# Patient Record
Sex: Male | Born: 1975 | Race: White | Hispanic: No | Marital: Single | Smoking: Current some day smoker
Health system: Southern US, Community
[De-identification: ages and names within clinical notes are randomized; demographics above are authoritative.]

## PROBLEM LIST (undated history)

## (undated) DIAGNOSIS — K219 Gastro-esophageal reflux disease without esophagitis: Secondary | ICD-10-CM

## (undated) HISTORY — PX: KNEE SURGERY: SHX244

## (undated) HISTORY — PX: NOSE SURGERY: SHX723

---

## 2010-12-28 DIAGNOSIS — N200 Calculus of kidney: Secondary | ICD-10-CM | POA: Insufficient documentation

## 2014-02-19 DIAGNOSIS — M25562 Pain in left knee: Secondary | ICD-10-CM | POA: Insufficient documentation

## 2019-08-15 ENCOUNTER — Encounter (HOSPITAL_COMMUNITY): Payer: Self-pay | Admitting: Emergency Medicine

## 2019-08-15 ENCOUNTER — Encounter (HOSPITAL_COMMUNITY): Payer: Self-pay

## 2019-08-15 ENCOUNTER — Emergency Department (HOSPITAL_COMMUNITY)
Admission: EM | Admit: 2019-08-15 | Discharge: 2019-08-16 | Disposition: A | Discharge status: Home / Self Care | Payer: BC Managed Care – PPO | Source: Home / Self Care | Attending: Emergency Medicine | Admitting: Emergency Medicine

## 2019-08-15 ENCOUNTER — Emergency Department (HOSPITAL_COMMUNITY): Payer: BC Managed Care – PPO

## 2019-08-15 ENCOUNTER — Other Ambulatory Visit: Payer: Self-pay

## 2019-08-15 ENCOUNTER — Emergency Department (HOSPITAL_COMMUNITY)
Admission: EM | Admit: 2019-08-15 | Discharge: 2019-08-15 | Disposition: A | Discharge status: Home / Self Care | Payer: BC Managed Care – PPO | Attending: Emergency Medicine | Admitting: Emergency Medicine

## 2019-08-15 DIAGNOSIS — Z20828 Contact with and (suspected) exposure to other viral communicable diseases: Secondary | ICD-10-CM | POA: Insufficient documentation

## 2019-08-15 DIAGNOSIS — R197 Diarrhea, unspecified: Secondary | ICD-10-CM | POA: Diagnosis not present

## 2019-08-15 DIAGNOSIS — K529 Noninfective gastroenteritis and colitis, unspecified: Secondary | ICD-10-CM

## 2019-08-15 DIAGNOSIS — R1013 Epigastric pain: Secondary | ICD-10-CM | POA: Diagnosis present

## 2019-08-15 HISTORY — DX: Gastro-esophageal reflux disease without esophagitis: K21.9

## 2019-08-15 LAB — COMPREHENSIVE METABOLIC PANEL
ALT: 36 U/L (ref 0–44)
ALT: 33 U/L (ref 0–44)
AST: 41 U/L (ref 15–41)
AST: 38 U/L (ref 15–41)
Albumin: 4 g/dL (ref 3.5–5.0)
Albumin: 4 g/dL (ref 3.5–5.0)
Alkaline Phosphatase: 64 U/L (ref 38–126)
Alkaline Phosphatase: 68 U/L (ref 38–126)
Anion gap: 10 (ref 5–15)
Anion gap: 11 (ref 5–15)
BUN: 10 mg/dL (ref 6–20)
BUN: 9 mg/dL (ref 6–20)
CO2: 26 mmol/L (ref 22–32)
CO2: 22 mmol/L (ref 22–32)
Calcium: 9.4 mg/dL (ref 8.9–10.3)
Calcium: 9.3 mg/dL (ref 8.9–10.3)
Chloride: 103 mmol/L (ref 98–111)
Chloride: 103 mmol/L (ref 98–111)
Creatinine, Ser: 1.24 mg/dL (ref 0.61–1.24)
Creatinine, Ser: 1.21 mg/dL (ref 0.61–1.24)
GFR calc Af Amer: >60 mL/min (ref >60)
GFR calc Af Amer: >60 mL/min (ref >60)
GFR calc non Af Amer: >60 mL/min (ref >60)
GFR calc non Af Amer: >60 mL/min (ref >60)
Glucose, Bld: 100 mg/dL — ABNORMAL HIGH (ref 70–99)
Glucose, Bld: 104 mg/dL — ABNORMAL HIGH (ref 70–99)
Potassium: 4.2 mmol/L (ref 3.5–5.1)
Potassium: 3.6 mmol/L (ref 3.5–5.1)
Sodium: 139 mmol/L (ref 135–145)
Sodium: 136 mmol/L (ref 135–145)
Total Bilirubin: 0.5 mg/dL (ref 0.3–1.2)
Total Bilirubin: 0.8 mg/dL (ref 0.3–1.2)
Total Protein: 7.3 g/dL (ref 6.5–8.1)
Total Protein: 7.2 g/dL (ref 6.5–8.1)

## 2019-08-15 LAB — URINALYSIS, ROUTINE W REFLEX MICROSCOPIC
Bilirubin Urine: NEGATIVE
Bilirubin Urine: NEGATIVE
Glucose, UA: NEGATIVE mg/dL
Glucose, UA: NEGATIVE mg/dL
Hgb urine dipstick: NEGATIVE
Hgb urine dipstick: NEGATIVE
Ketones, ur: 5 mg/dL — AB
Ketones, ur: 5 mg/dL — AB
Leukocytes,Ua: NEGATIVE
Leukocytes,Ua: NEGATIVE
Nitrite: NEGATIVE
Nitrite: NEGATIVE
Protein, ur: NEGATIVE mg/dL
Protein, ur: NEGATIVE mg/dL
Specific Gravity, Urine: 1.025 (ref 1.005–1.030)
Specific Gravity, Urine: 1.025 (ref 1.005–1.030)
pH: 5 (ref 5.0–8.0)
pH: 5 (ref 5.0–8.0)

## 2019-08-15 LAB — CBC
HCT: 48.1 % (ref 39.0–52.0)
HCT: 49.2 % (ref 39.0–52.0)
Hemoglobin: 16.3 g/dL (ref 13.0–17.0)
Hemoglobin: 16.9 g/dL (ref 13.0–17.0)
MCH: 31.7 pg (ref 26.0–34.0)
MCH: 31.8 pg (ref 26.0–34.0)
MCHC: 33.9 g/dL (ref 30.0–36.0)
MCHC: 34.3 g/dL (ref 30.0–36.0)
MCV: 93.6 fL (ref 80.0–100.0)
MCV: 92.7 fL (ref 80.0–100.0)
Platelets: 299 10*3/uL (ref 150–400)
Platelets: 324 10*3/uL (ref 150–400)
RBC: 5.14 MIL/uL (ref 4.22–5.81)
RBC: 5.31 MIL/uL (ref 4.22–5.81)
RDW: 12 % (ref 11.5–15.5)
RDW: 11.9 % (ref 11.5–15.5)
WBC: 9.1 10*3/uL (ref 4.0–10.5)
WBC: 9.8 10*3/uL (ref 4.0–10.5)
nRBC: 0 % (ref 0.0–0.2)
nRBC: 0 % (ref 0.0–0.2)

## 2019-08-15 LAB — C DIFFICILE QUICK SCREEN W PCR REFLEX
C Diff antigen: NEGATIVE
C Diff interpretation: NOT DETECTED
C Diff toxin: NEGATIVE

## 2019-08-15 LAB — LIPASE, BLOOD
Lipase: 28 U/L (ref 11–51)
Lipase: 25 U/L (ref 11–51)

## 2019-08-15 MED ORDER — CIPROFLOXACIN HCL 500 MG PO TABS: 500.0000 mg | ORAL_TABLET | Freq: Two times a day (BID) | ORAL | 0 refills | Status: DC

## 2019-08-15 MED ORDER — ONDANSETRON HCL 4 MG PO TABS: 4.0000 mg | ORAL_TABLET | Freq: Four times a day (QID) | ORAL | 0 refills | Status: DC | PRN

## 2019-08-15 MED ORDER — SODIUM CHLORIDE 0.9 % IV BOLUS
500.0000 mL | Freq: Once | INTRAVENOUS | Status: AC
  Administered 2019-08-15: 500 mL via INTRAVENOUS

## 2019-08-15 MED ORDER — SODIUM CHLORIDE 0.9 % IV BOLUS
1000.0000 mL | Freq: Once | INTRAVENOUS | Status: AC
  Administered 2019-08-15: 1000 mL via INTRAVENOUS

## 2019-08-15 MED ORDER — SODIUM CHLORIDE 0.9% FLUSH: 3.0000 mL | Freq: Once | INTRAVENOUS | Status: DC

## 2019-08-15 MED ORDER — DICYCLOMINE HCL 10 MG PO CAPS
10.0000 mg | ORAL_CAPSULE | Freq: Once | ORAL | Status: DC
  Filled 2019-08-15: qty 1

## 2019-08-15 MED ORDER — IOHEXOL 300 MG/ML  SOLN
100.0000 mL | Freq: Once | INTRAMUSCULAR | Status: AC | PRN
  Administered 2019-08-15: 100 mL via INTRAVENOUS

## 2019-08-15 MED ORDER — ONDANSETRON 4 MG PO TBDP
4.0000 mg | ORAL_TABLET | Freq: Once | ORAL | Status: AC
  Administered 2019-08-15: 4 mg via ORAL
  Filled 2019-08-15: qty 1

## 2019-08-15 MED ORDER — DICYCLOMINE HCL 20 MG PO TABS: 20.0000 mg | ORAL_TABLET | Freq: Two times a day (BID) | ORAL | 0 refills | Status: DC

## 2019-08-15 NOTE — Discharge Instructions (Addendum)
Imodium as directed on package [employer] Follow up closely with your primary care doctor.  Get help right away if: You have chest pain. You feel extremely weak or you faint. You have bloody or black stools or stools that look like tar. You have severe pain, cramping, or bloating in your abdomen. You have trouble breathing or you are breathing very quickly. Your heart is beating very quickly. Your skin feels cold and clammy. You feel confused. You have signs of dehydration, such as: Dark urine, very little urine, or no urine. Cracked lips. Dry mouth. Sunken eyes. Sleepiness. Weakness.

## 2019-08-15 NOTE — ED Notes (Signed)
Pt called out asking for some meds for nausea.

## 2019-08-15 NOTE — ED Notes (Addendum)
Pt verbalized understanding of followup care, scripts and need for continued hydration. Pt also verbalized understanding of development of symptoms that would require return to ED.

## 2019-08-15 NOTE — ED Triage Notes (Signed)
Pt reports epigastric cramping and diarrhea since last Monday, also reports muscle aches Tuesday, denies fever or vomiting.

## 2019-08-15 NOTE — ED Provider Notes (Signed)
[facility] Provider Note   CSN: [id number] Arrival date & time: 08/15/19  1050     History   Chief Complaint Chief Complaint  Patient presents with  . Abdominal Pain  . Diarrhea    HPI [name] is a 43 y.o. male.     HPI   Pt is a 43 y/o male with a h/o GERD, seasonal allergies who presents to the ED today for eval of abd cramping and diarrhea that began about 6 days ago. He reports 8-10 episodes of diarrhea per day. States it is mostly watery. Denies any NV. Denies fevers. No bloody stools. States abd pain is located to the upper abdomen. Rates pain as mild currently.  Denies chest pain. Denies shortness of breath. Denies cough, congestion, sore throat.   States that his significant other had similar sxs this week but her symptoms have improved. He denies any recent travel. Denies eating any suspect foods. Denies recent abx use.   Past Medical History:  Diagnosis Date  . GERD (gastroesophageal reflux disease)     There are no active problems to display for this patient.   Past Surgical History:  Procedure Laterality Date  . KNEE SURGERY Left   . NOSE SURGERY        Home Medications    Prior to Admission medications   Not on File    Family History No family history on file.  Social History Social History   Tobacco Use  . Smoking status: Not on file  Substance Use Topics  . Alcohol use: Not on file  . Drug use: Not on file     Allergies   Patient has no allergy information on record.   Review of Systems Review of Systems  Constitutional: Negative for chills and fever.  HENT: Negative for ear pain and sore throat.   Eyes: Negative for visual disturbance.  Respiratory: Negative for cough and shortness of breath.   Cardiovascular: Negative for chest pain.  Gastrointestinal: Positive for abdominal pain and diarrhea. Negative for constipation, nausea and vomiting.  Genitourinary: Negative for dysuria and  hematuria.  Musculoskeletal: Negative for back pain.  Skin: Negative for rash.  Neurological: Negative for headaches.  All other systems reviewed and are negative.   Physical Exam Updated Vital Signs BP 136/75 (BP Location: Left Arm)   Pulse 96   Temp 98.5 F (36.9 C) (Oral)   Resp 20   Ht 5\' 6"  (1.676 m)   Wt 63.5 kg   SpO2 99%   BMI 22.60 kg/m   Physical Exam Vitals signs and nursing note reviewed.  Constitutional:      Appearance: He is well-developed. He is not ill-appearing.  HENT:     Head: Normocephalic and atraumatic.     Mouth/Throat:     Mouth: Mucous membranes are dry.  Eyes:     Conjunctiva/sclera: Conjunctivae normal.  Neck:     Musculoskeletal: Neck supple.  Cardiovascular:     Rate and Rhythm: Normal rate and regular rhythm.     Heart sounds: Normal heart sounds. No murmur.  Pulmonary:     Effort: Pulmonary effort is normal. No respiratory distress.     Breath sounds: Normal breath sounds. No wheezing, rhonchi or rales.  Abdominal:     Palpations: Abdomen is soft.     Tenderness: There is no abdominal tenderness. There is no right CVA tenderness or left CVA tenderness.  Skin:    General: Skin is warm and dry.  Neurological:     Mental Status: He is alert.    ED Treatments / Results  Labs (all labs ordered are listed, but only abnormal results are displayed) Labs Reviewed  COMPREHENSIVE METABOLIC PANEL - Abnormal; Notable for the following components:      Result Value   Glucose, Bld 100 (*)    All other components within normal limits  URINALYSIS, ROUTINE W REFLEX MICROSCOPIC - Abnormal; Notable for the following components:   Ketones, ur 5 (*)    All other components within normal limits  GASTROINTESTINAL PANEL BY PCR, STOOL (REPLACES STOOL CULTURE)  C DIFFICILE QUICK SCREEN W PCR REFLEX  NOVEL CORONAVIRUS, NAA (HOSPITAL ORDER, SEND-OUT TO REF LAB)  LIPASE, BLOOD  CBC    EKG None  Radiology No results found.  Procedures  Procedures (including critical care time)  Medications Ordered in ED Medications  sodium chloride flush (NS) 0.9 % injection 3 mL (has no administration in time range)  sodium chloride 0.9 % bolus 500 mL (has no administration in time range)  dicyclomine (BENTYL) capsule 10 mg (has no administration in time range)     Initial Impression / Assessment and Plan / ED Course  I have reviewed the triage vital signs and the nursing notes.  Pertinent labs & imaging results that were available during my care of the patient were reviewed by me and considered in my medical decision making (see chart for details).     Final Clinical Impressions(s) / ED Diagnoses   Final diagnoses:  Diarrhea, unspecified type   43 year old male presenting with 6-day history of watery diarrhea.  No bloody stools.  Abdominal cramping present.  No focal abdominal pain.  No fevers.  Patient afebrile with reassuring vital signs.  Abdomen is without peritoneal signs.  No focal tenderness present.  He does appear somewhat dry.  Labs ordered in triage.  Will obtain GI panel, C. difficile and COVID testing as well.  Patient will be given Bentyl and IV fluids.  CBC WNL CMP with no gross electrolyte derangement.  Normal LFTs and kidney function Lipase within normal limits UA w/o evidence of UTI GI panel collected and pending Cdiff testing is negative COVID testing pending. Pt advised on self quarantine measures.  On reassessment, pt Appears well.  He declined Bentyl stating that he is not having any significant abdominal pain that he feels needs to be treated with medication at this time.  He has had no episodes of vomiting while in the ED.  Repeat abdominal exam reveals no focal tenderness.  Discussed results and plan for discharge with Bentyl.  I discussed that at this time I do not feel he needs CT scan as he likely has a viral process given his symptoms developed shortly after his significant other had the symptoms  earlier this week.  Less likely bacterial cause or secondary to acute intra-abdominal cause she has no history of diverticulosis and labs and vital signs are reassuring.  Discussed that if symptoms progress, if abdominal pain becomes persistent, if he develops fevers or bloody stools then he needs to return to the emergency department as he may need imaging at that time.  He voices understanding and is in agreement with plan.  All questions answered.  Patient stable for discharge  -------------------  Thora Lance was evaluated in Emergency Department on 08/15/2019 for the symptoms described in the history of present illness. He was evaluated in the context of the global COVID-19 pandemic, which necessitated consideration that the patient  might be at risk for infection with the SARS-CoV-2 virus that causes COVID-19. Institutional protocols and algorithms that pertain to the evaluation of patients at risk for COVID-19 are in a state of rapid change based on information released by regulatory bodies including the CDC and federal and state organizations. These policies and algorithms were followed during the patient's care in the ED.   ED Discharge Orders    None       Rayne DuCouture, Gwendolyn Nishi S, PA-C 08/15/19 1700    Tegeler, Canary Brimhristopher J, MD 08/15/19 1946

## 2019-08-15 NOTE — ED Notes (Signed)
Patient transported to CT 

## 2019-08-15 NOTE — ED Provider Notes (Signed)
[facility]   CSN: [id number] Arrival date & time: 08/15/19  2039     History   Chief Complaint Chief Complaint  Patient presents with  . Abdominal Pain  . Diarrhea    HPI Kevin Shaffer is a 43 y.o. male returns emergency department after being seen earlier today for complaint of 6 days of diarrhea, multiple episodes of watery brown stool, new blood or mucus, he is having severe abdominal cramping but denies nausea or vomiting.  Patient has not had any fevers, no recent foreign travel, no contacts with similar symptoms.    HPI  Past Medical History:  Diagnosis Date  . GERD (gastroesophageal reflux disease)     There are no active problems to display for this patient.   Past Surgical History:  Procedure Laterality Date  . KNEE SURGERY Left   . NOSE SURGERY          Home Medications    Prior to Admission medications   Medication Sig Start Date End Date Taking? Authorizing Provider  dicyclomine (BENTYL) 20 MG tablet Take 1 tablet (20 mg total) by mouth 2 (two) times daily for 5 days. 08/15/19 08/20/19  Couture, Cortni S, PA-C  diphenhydrAMINE (BENADRYL) 25 mg capsule Take 25 mg by mouth every 6 (six) hours as needed for allergies.    [provider]  fluticasone (FLONASE) 50 MCG/ACT nasal spray Place 2 sprays into both nostrils daily.    [provider]  tadalafil (CIALIS) 5 MG tablet Take 5 mg by mouth daily as needed for erectile dysfunction. 07/07/19   [provider]    Family History No family history on file.  Social History Social History   Tobacco Use  . Smoking status: Not on file  Substance Use Topics  . Alcohol use: Not on file  . Drug use: Not on file     Allergies   Patient has no known allergies.   Review of Systems Review of Systems  Ten systems reviewed and are negative for acute change, except as noted in the HPI.   Physical Exam Updated Vital Signs BP  135/80 (BP Location: Right Arm)   Pulse (!) 102   Temp 98.6 F (37 C) (Oral)   Resp 18   SpO2 98%   Physical Exam Vitals signs and nursing note reviewed.  Constitutional:      General: He is not in acute distress.    Appearance: He is well-developed. He is not diaphoretic.  HENT:     Head: Normocephalic and atraumatic.  Eyes:     General: No scleral icterus.    Conjunctiva/sclera: Conjunctivae normal.  Neck:     Musculoskeletal: Normal range of motion and neck supple.  Cardiovascular:     Rate and Rhythm: Normal rate and regular rhythm.     Heart sounds: Normal heart sounds.  Pulmonary:     Effort: Pulmonary effort is normal. No respiratory distress.     Breath sounds: Normal breath sounds.  Abdominal:     Palpations: Abdomen is soft.     Tenderness: There is no abdominal tenderness. There is guarding.  Skin:    General: Skin is warm and dry.  Neurological:     Mental Status: He is alert.  Psychiatric:        Behavior: Behavior normal.      ED Treatments / Results  Labs (all labs ordered are listed, but only abnormal results are displayed) Labs Reviewed  COMPREHENSIVE METABOLIC  PANEL - Abnormal; Notable for the following components:      Result Value   Glucose, Bld 104 (*)    All other components within normal limits  LIPASE, BLOOD  CBC  URINALYSIS, ROUTINE W REFLEX MICROSCOPIC    EKG None  Radiology No results found.  Procedures Procedures (including critical care time)  Medications Ordered in ED Medications  sodium chloride flush (NS) 0.9 % injection 3 mL (has no administration in time range)  sodium chloride 0.9 % bolus 1,000 mL (has no administration in time range)     Initial Impression / Assessment and Plan / ED Course  I have reviewed the triage vital signs and the nursing notes.  Pertinent labs & imaging results that were available during my care of the patient were reviewed by me and considered in my medical decision making (see chart for  details).    Patient return to emergency department for continued abdominal cramping and diarrhea.  I reviewed the patient's labs from earlier which showed no elevated white blood cell count, no significant abnormalities on the chemistry panel.  Patient had negative C. difficile.  His stool culture is pending.  I personally reviewed the patient's CT scan of the abdomen which shows small bowel thickening and dilated loops of bowel consistent with enteritis.  There is some free fluid in the pelvis which I reviewed with Dr. Vallery Ridge and with Dr. Serita Grammes of surgery she is likely physiologic transudative material secondary to his enteritis.  Reviewed current literature and given the fact that he has had 6 days he is on the borderline of needing indications for antibiotic therapy and I will go ahead and prescribe Cipro 500 mg twice daily for 5 days.  Patient receiving IV fluids at this time will discharge.  Encourage patient to use antidiarrheals as he is not having bloody diarrhea at this time.  Patient otherwise appears appropriate for discharge and I discussed return precautions and outpatient follow-up.     Final Clinical Impressions(s) / ED Diagnoses   Final diagnoses:  None    ED Discharge Orders    None       Margarita Mail, PA-C 08/16/19 0024    Charlesetta Shanks, MD 09/02/19 [phone]

## 2019-08-15 NOTE — ED Notes (Signed)
Pt moved to rm 5 for comfort and direct access to bathroom due to diarrhea

## 2019-08-15 NOTE — ED Triage Notes (Signed)
Pt c/o upper abd pain and diarrhea. Pain at a 7 and not relieved by OTC taken at home.

## 2019-08-15 NOTE — ED Notes (Signed)
Patient verbalizes understanding of discharge instructions. Opportunity for questioning and answers were provided. Pt discharged from ED. 

## 2019-08-15 NOTE — Discharge Instructions (Addendum)
Stay well hydrated. You were  given medication to help with the abdominal cramping .  You were tested for the coronavirus and your stool was also tested.  The results of these studies will not be available for at least 3 to 5 days.  If the results are abnormal you will be contacted by the hospital and provided with instruction.  If the studies are negative you will not be contacted but you may look at your results on your MyChart.  You will need to self quarantine until you have the results of your coronavirus testing.  If it is positive you will need to self quarantine for at least 7 days since symptoms started.  You will need to have improvement of your symptoms for 72 hours and be fever free for 72 hours before returning to work.  Please follow up with your primary doctor within the next 5-7 days.  Please return to the ER sooner if you have any new or worsening symptoms, or if you have any of the following symptoms:  Abdominal pain that does not go away.  You have a fever.  You keep throwing up (vomiting).  The pain is felt only in portions of the abdomen. Pain in the right side could possibly be appendicitis. In an adult, pain in the left lower portion of the abdomen could be colitis or diverticulitis.  You pass bloody or black tarry stools.  There is bright red blood in the stool.  The constipation stays for more than 4 days.  There is belly (abdominal) or rectal pain.  You do not seem to be getting better.  You have any questions or concerns.

## 2019-08-15 NOTE — ED Notes (Signed)
Pt given warm blankets. Pt asks if he can have a note for work.

## 2019-08-16 LAB — GASTROINTESTINAL PANEL BY PCR, STOOL (REPLACES STOOL CULTURE)

## 2019-08-16 LAB — NOVEL CORONAVIRUS, NAA (HOSP ORDER, SEND-OUT TO REF LAB; TAT 18-24 HRS): SARS-CoV-2, NAA: NOT DETECTED

## 2020-08-15 IMAGING — CT CT ABDOMEN AND PELVIS WITH CONTRAST
3 of 5 series · 16 of 46 positions shown, 18 images · IV contrast (APPLIED)
Comparison: None.

CLINICAL DATA: Upper abdominal pain, diarrhea

EXAM:
CT ABDOMEN AND PELVIS WITH CONTRAST
TECHNIQUE: Multidetector CT imaging of the abdomen and pelvis was performed
using the standard protocol following bolus administration of
intravenous contrast.
CONTRAST:  100mL OMNIPAQUE IOHEXOL 300 MG/ML  SOLN

[Series 3: abdomen 5.0 · axial · 0.64mm/px · z∈[+1116,+1471]mm · 11 of 87 slices shown, 13 images]
[im 8/87  soft-tissue]
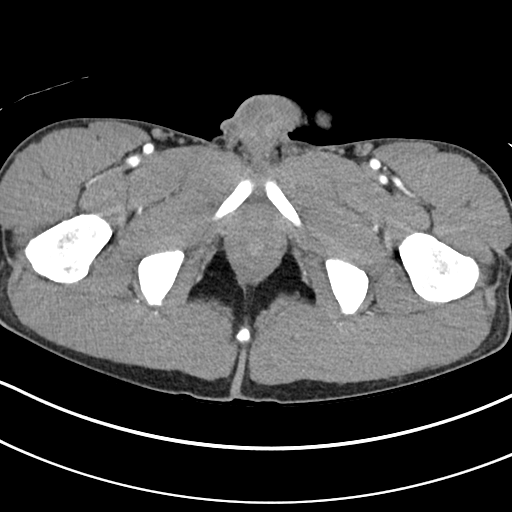
[im 8/87  bone]
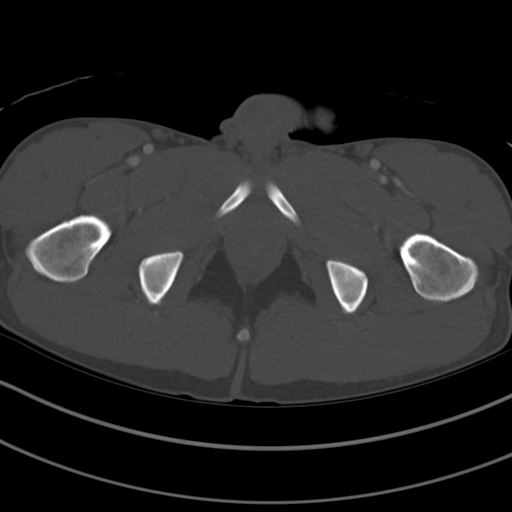
[im 15/87  soft-tissue]
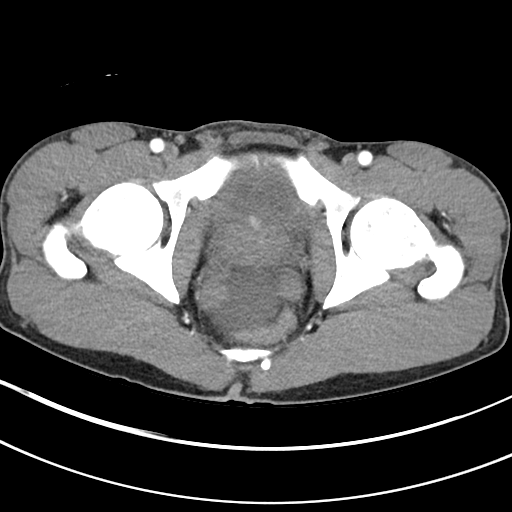
[im 22/87  soft-tissue]
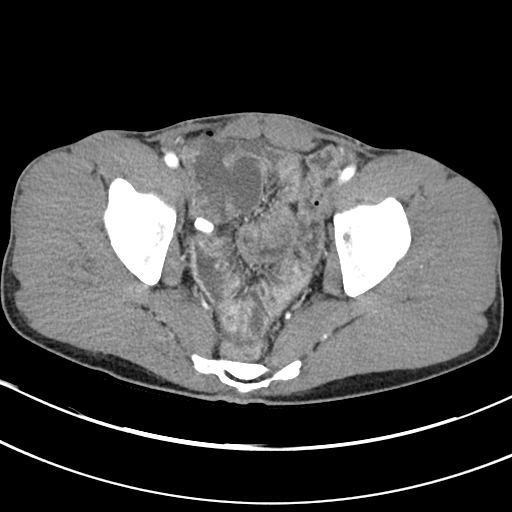
[im 29/87  soft-tissue]
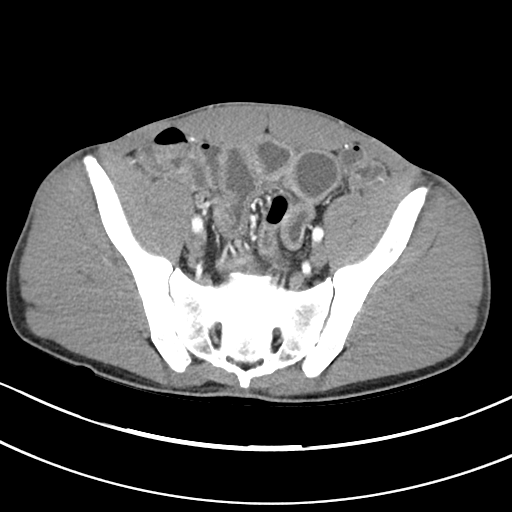
[im 36/87  soft-tissue]
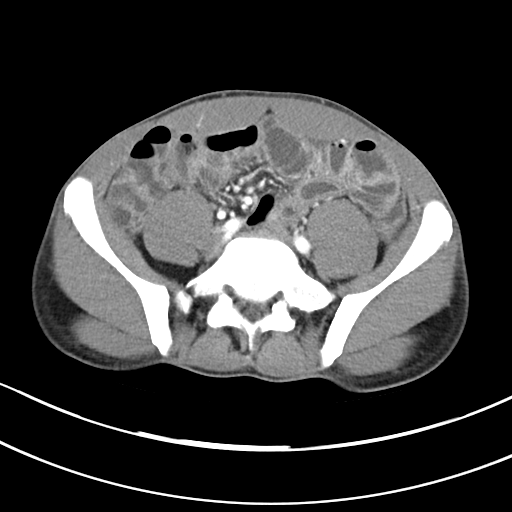
[im 44/87  soft-tissue]
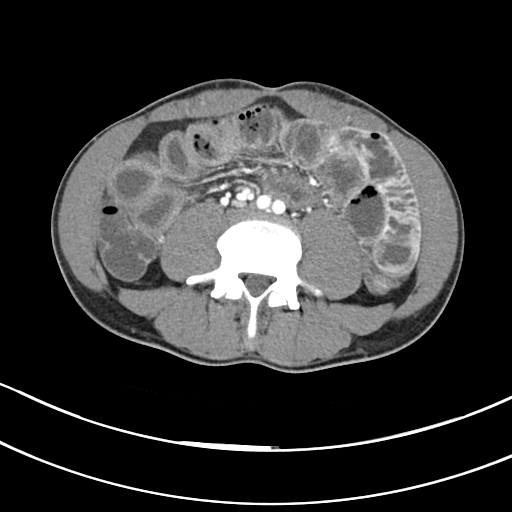
[im 51/87  soft-tissue]
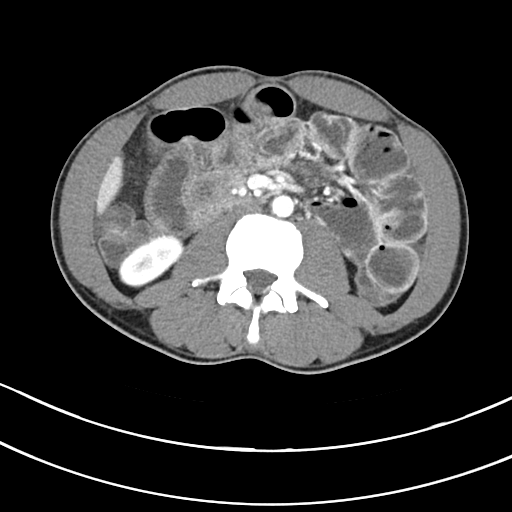
[im 58/87  soft-tissue]
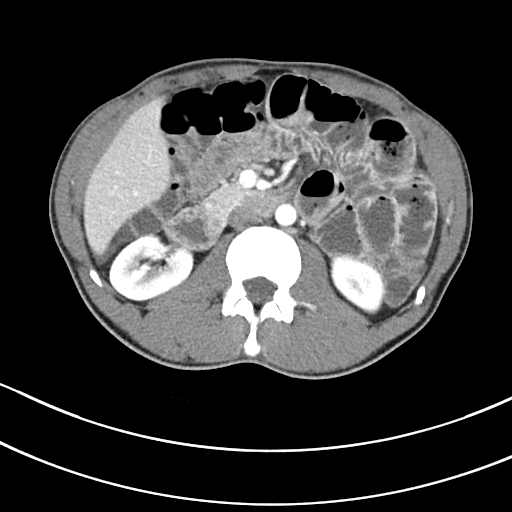
[im 65/87  soft-tissue]
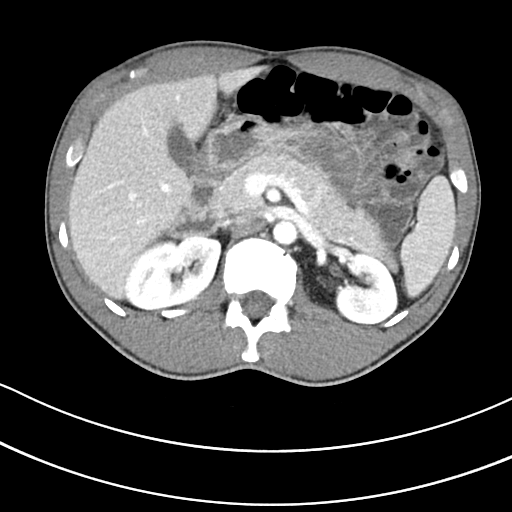
[im 65/87  bone]
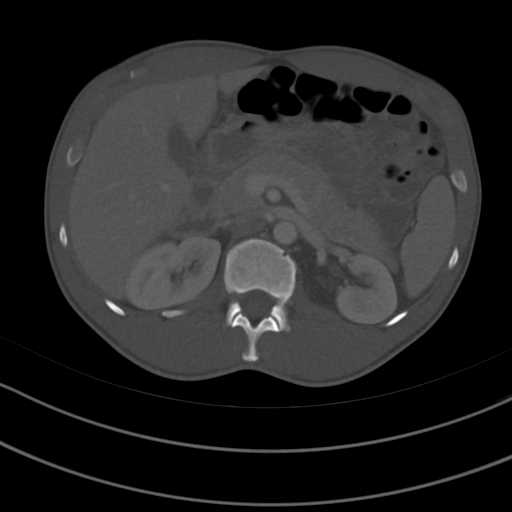
[im 72/87  soft-tissue]
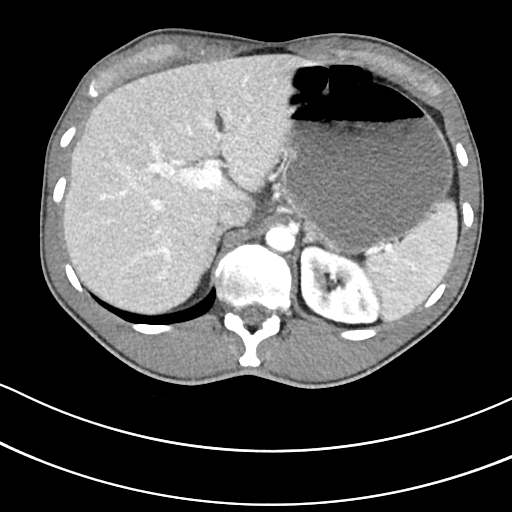
[im 79/87  soft-tissue]
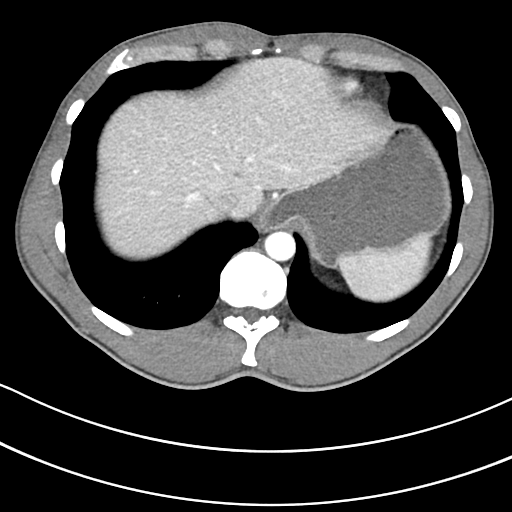

[Series 5: lung · axial · 0.64mm/px · z∈[+1351,+1379]mm · 2 of 88 slices shown]
[im 8/88  bone]
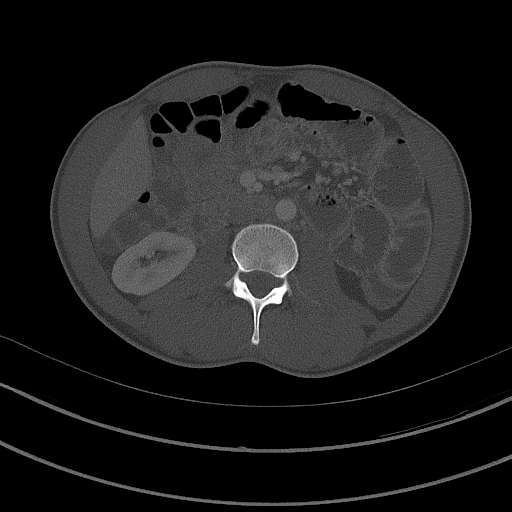
[im 22/88  bone]
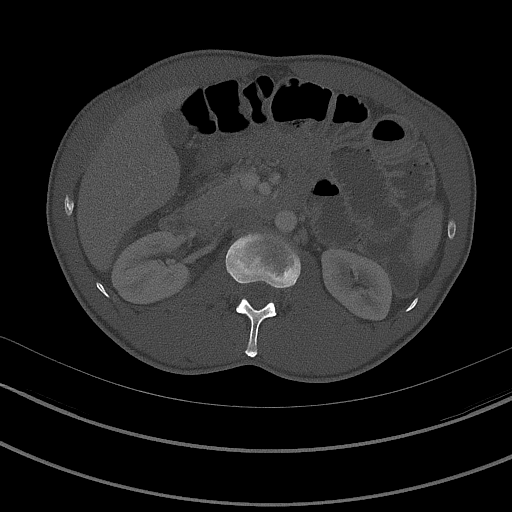

[Series 6: abdomen 3.0 mpr cor · coronal · 0.65mm/px · 3 of 75 slices shown]
[im 25/75  soft-tissue]
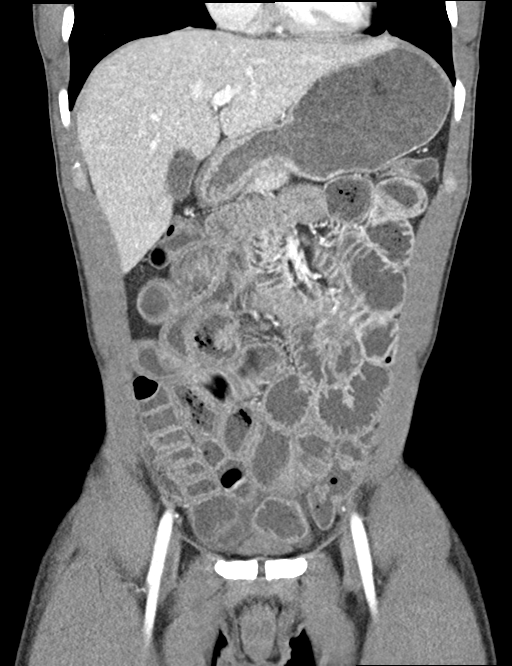
[im 33/75  soft-tissue]
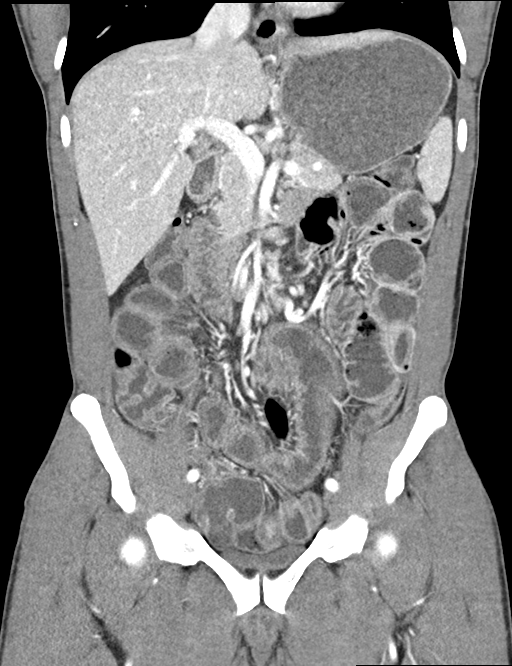
[im 42/75  soft-tissue]
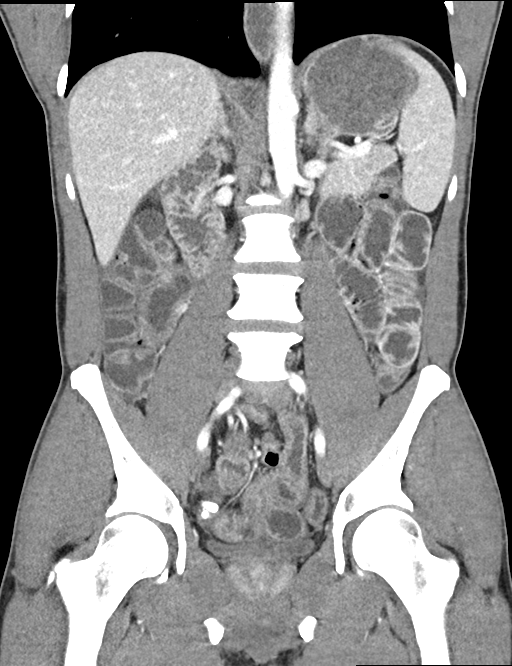

[16 of 46 positions shown; findings below may reference images not displayed]

FINDINGS: Lower chest: Lung bases are clear. No effusions. Heart is normal
size. Distal esophagus is fluid-filled which may be related to
reflux or dysmotility.

Hepatobiliary: Diffuse low-density throughout the liver compatible
with fatty infiltration. No focal abnormality. Gallbladder
unremarkable.

Pancreas: No focal abnormality or ductal dilatation.

Spleen: No focal abnormality.  Normal size.

Adrenals/Urinary Tract: No adrenal abnormality. No focal renal
abnormality. No stones or hydronephrosis. Urinary bladder is
unremarkable.

Stomach/Bowel: Small bowel is diffusely fluid-filled and mildly
prominent diffusely. Some areas of small bowel wall appears
thickened, particularly within the pelvis. Findings could reflect
small bowel enteritis. No decompressed distal small bowel to suggest
obstruction. Stomach is fluid-filled. Colon is decompressed.

Vascular/Lymphatic: No evidence of aneurysm or adenopathy.

Reproductive: No visible focal abnormality.

Other: Small to moderate free fluid in the cul-de-sac.

Musculoskeletal: No acute5 bony abnormality.
IMPRESSION: Diffusely distended fluid-filled small bowel, with some loops
appearing to have wall thickening. Findings could reflect diffuse
small bowel enteritis. Stomach is also distended with fluid. Fluid
in the distal esophagus could be related to reflux.

Mild diffuse fatty infiltration of the liver.

Small to moderate free fluid in the pelvis.

## 2021-12-26 ENCOUNTER — Encounter: Payer: Self-pay | Admitting: Family Medicine

## 2021-12-26 DIAGNOSIS — Z1211 Encounter for screening for malignant neoplasm of colon: Secondary | ICD-10-CM | POA: Diagnosis not present

## 2021-12-26 DIAGNOSIS — K635 Polyp of colon: Secondary | ICD-10-CM | POA: Diagnosis not present

## 2021-12-26 DIAGNOSIS — D125 Benign neoplasm of sigmoid colon: Secondary | ICD-10-CM | POA: Diagnosis not present

## 2021-12-26 LAB — HM COLONOSCOPY

## 2022-09-03 DIAGNOSIS — Z0000 Encounter for general adult medical examination without abnormal findings: Secondary | ICD-10-CM | POA: Diagnosis not present

## 2022-09-03 DIAGNOSIS — Z2821 Immunization not carried out because of patient refusal: Secondary | ICD-10-CM | POA: Diagnosis not present

## 2022-09-03 DIAGNOSIS — K219 Gastro-esophageal reflux disease without esophagitis: Secondary | ICD-10-CM | POA: Diagnosis not present

## 2022-09-03 DIAGNOSIS — R079 Chest pain, unspecified: Secondary | ICD-10-CM | POA: Diagnosis not present

## 2022-09-03 LAB — LIPID PANEL
Cholesterol: 187 (ref 0–200)
HDL: 35 (ref 35–70)
LDL Cholesterol: 120
LDl/HDL Ratio: 5.3
Triglycerides: 289 — AB (ref 40–160)

## 2022-09-03 LAB — BASIC METABOLIC PANEL WITH GFR
BUN: 13 (ref 4–21)
CO2: 25 — AB (ref 13–22)
Chloride: 104 (ref 99–108)
Creatinine: 1.4 — AB (ref 0.6–1.3)
Glucose: 83
Potassium: 4.6 meq/L (ref 3.5–5.1)
Sodium: 138 (ref 137–147)

## 2022-09-03 LAB — COMPREHENSIVE METABOLIC PANEL
Albumin: 4.4 (ref 3.5–5.0)
Calcium: 9.8 (ref 8.7–10.7)
eGFR: 65

## 2022-09-03 LAB — CBC AND DIFFERENTIAL
HCT: 50 (ref 41–53)
Hemoglobin: 17.4 (ref 13.5–17.5)
Platelets: 274 10*3/uL (ref 150–400)
WBC: 6.5

## 2022-09-03 LAB — HEPATITIS B SURFACE ANTIGEN: Hepatitis B Surface Ag: NONREACTIVE

## 2022-09-03 LAB — HM HEPATITIS C SCREENING LAB: HM Hepatitis Screen: NEGATIVE

## 2022-09-03 LAB — CBC: RBC: 5.4 — AB (ref 3.87–5.11)

## 2022-09-03 LAB — HEPATIC FUNCTION PANEL
ALT: 22 U/L (ref 10–40)
AST: 18 (ref 14–40)
Alkaline Phosphatase: 74 (ref 25–125)
Bilirubin, Total: 0.4

## 2022-09-03 LAB — HIV ANTIBODY (ROUTINE TESTING W REFLEX): HIV 1&2 Ab, 4th Generation: NONREACTIVE

## 2022-09-26 DIAGNOSIS — I493 Ventricular premature depolarization: Secondary | ICD-10-CM | POA: Diagnosis not present

## 2022-09-26 DIAGNOSIS — R079 Chest pain, unspecified: Secondary | ICD-10-CM | POA: Diagnosis not present

## 2023-10-15 ENCOUNTER — Ambulatory Visit: Payer: BC Managed Care – PPO | Admitting: Family Medicine

## 2023-10-15 VITALS — BP 136/73 | HR 84 | Ht 66.0 in | Wt 153.0 lb

## 2023-10-15 DIAGNOSIS — E782 Mixed hyperlipidemia: Secondary | ICD-10-CM

## 2023-10-15 DIAGNOSIS — N529 Male erectile dysfunction, unspecified: Secondary | ICD-10-CM

## 2023-10-15 DIAGNOSIS — R0981 Nasal congestion: Secondary | ICD-10-CM | POA: Diagnosis not present

## 2023-10-15 DIAGNOSIS — Z0000 Encounter for general adult medical examination without abnormal findings: Secondary | ICD-10-CM

## 2023-10-15 DIAGNOSIS — R030 Elevated blood-pressure reading, without diagnosis of hypertension: Secondary | ICD-10-CM | POA: Diagnosis not present

## 2023-10-15 DIAGNOSIS — K219 Gastro-esophageal reflux disease without esophagitis: Secondary | ICD-10-CM

## 2023-10-15 MED ORDER — TADALAFIL 5 MG PO TABS: 5.0000 mg | ORAL_TABLET | Freq: Every day | ORAL | 3 refills | Status: DC | PRN

## 2023-10-15 NOTE — Progress Notes (Unsigned)
New Patient Office Visit  Subjective    Patient ID: Kevin Shaffer, male    DOB: 1976-12-02  Age: 46 y.o. MRN: [id number]  CC:  Chief Complaint  Patient presents with   Establish Care    HPI Kevin Shaffer presents to establish care Hx of GERD, takes Omeprazole PRN. Usually 2-3 x/wk.  Hx of ED - uses tadalafil 5mg   Hx of chronic nasal congestion - uses benadryl at night.    Outpatient Encounter Medications as of 10/15/2023  Medication Sig   tadalafil (CIALIS) 5 MG tablet Take 1 tablet (5 mg total) by mouth daily as needed for erectile dysfunction.   [DISCONTINUED] ciprofloxacin (CIPRO) 500 MG tablet Take 1 tablet (500 mg total) by mouth every 12 (twelve) hours.   [DISCONTINUED] dicyclomine (BENTYL) 20 MG tablet Take 1 tablet (20 mg total) by mouth 2 (two) times daily for 5 days.   [DISCONTINUED] diphenhydrAMINE (BENADRYL) 25 mg capsule Take 25 mg by mouth every 6 (six) hours as needed for allergies.   [DISCONTINUED] fluticasone (FLONASE) 50 MCG/ACT nasal spray Place 2 sprays into both nostrils daily.   [DISCONTINUED] ondansetron (ZOFRAN) 4 MG tablet Take 1 tablet (4 mg total) by mouth 4 (four) times daily as needed for nausea or vomiting.   [DISCONTINUED] tadalafil (CIALIS) 5 MG tablet Take 5 mg by mouth daily as needed for erectile dysfunction.   [DISCONTINUED] tadalafil (CIALIS) 5 MG tablet Take 1 tablet (5 mg total) by mouth daily as needed for erectile dysfunction.   No facility-administered encounter medications on file as of 10/15/2023.    Past Medical History:  Diagnosis Date   GERD (gastroesophageal reflux disease)     Past Surgical History:  Procedure Laterality Date   KNEE SURGERY Left    NOSE SURGERY      Family History  Problem Relation Age of Onset   Lymphoma Father     Social History   Socioeconomic History   Marital status: Single    Spouse name: Not on file   Number of children: Not on file   Years of education: Not on file   Highest education  level: Not on file  Occupational History   Not on file  Tobacco Use   Smoking status: Some Days    Types: Cigars   Smokeless tobacco: Never  Substance and Sexual Activity   Alcohol use: Yes    Alcohol/week: 3.0 - 5.0 standard drinks of alcohol    Types: 3 - 5 Standard drinks or equivalent per week   Drug use: Not on file   Sexual activity: Yes    Partners: Female  Other Topics Concern   Not on file  Social History Narrative   Not on file   Social Determinants of Health   Financial Resource Strain: Not on file  Food Insecurity: Not on file  Transportation Needs: Not on file  Physical Activity: Not on file  Stress: Not on file  Social Connections: Unknown (05/04/2022)   [facility]   Social Network    Social Network: Not on file  Intimate Partner Violence: Unknown (03/27/2022)   Received from [facility]   HITS    Physically Hurt: Not on file    Insult or Talk Down To: Not on file    Threaten Physical Harm: Not on file    Scream or Curse: Not on file    ROS      Objective    BP 136/73   Pulse 84  Ht 5\' 6"  (1.676 m)   Wt 153 lb (69.4 kg)   SpO2 99%   BMI 24.69 kg/m   Physical Exam Constitutional:      Appearance: Normal appearance.  HENT:     Head: Normocephalic and atraumatic.     Right Ear: Tympanic membrane, ear canal and external ear normal.     Left Ear: Tympanic membrane, ear canal and external ear normal.     Nose: Nose normal.     Mouth/Throat:     Pharynx: Oropharynx is clear.  Eyes:     Extraocular Movements: Extraocular movements intact.     Conjunctiva/sclera: Conjunctivae normal.     Pupils: Pupils are equal, round, and reactive to light.  Neck:     Thyroid: No thyromegaly.  Cardiovascular:     Rate and Rhythm: Normal rate and regular rhythm.  Pulmonary:     Effort: Pulmonary effort is normal.     Breath sounds: Normal breath sounds.  Abdominal:     General: Bowel sounds are normal.     Palpations: Abdomen is soft.      Tenderness: There is no abdominal tenderness.  Musculoskeletal:        General: No swelling.     Cervical back: Neck supple.  Skin:    General: Skin is warm and dry.  Neurological:     Mental Status: He is oriented to person, place, and time.  Psychiatric:        Mood and Affect: Mood normal.        Behavior: Behavior normal.         Assessment & Plan:   Problem List Items Addressed This Visit       Digestive   GERD (gastroesophageal reflux disease)     Other   Mixed hyperlipidemia    The 10-year ASCVD risk score (Arnett DK, et al., 2019) is: 3.9%   Values used to calculate the score:     Age: 71 years     Sex: Male     Is Non-Hispanic African American: No     Diabetic: No     Tobacco smoker: No     Systolic Blood Pressure: 136 mmHg     Is BP treated: No     HDL Cholesterol: 38 mg/dL     Total Cholesterol: 190 mg/dL        Relevant Medications   tadalafil (CIALIS) 5 MG tablet   Elevated BP without diagnosis of hypertension    BP borderline elevated. Will monitor.       Relevant Orders   CMP14+EGFR (Completed)   Lipid panel (Completed)   CBC (Completed)   TSH (Completed)   ED (erectile dysfunction)    RF tadalafil.       Relevant Medications   tadalafil (CIALIS) 5 MG tablet   Chronic nasal congestion - Primary    Continue antihistamine nightly.       Relevant Orders   Ambulatory referral to ENT   Other Visit Diagnoses     Wellness examination       Relevant Orders   CMP14+EGFR (Completed)   Lipid panel (Completed)   CBC (Completed)   TSH (Completed)       No follow-ups on file.   Nani Gasser, MD

## 2023-10-16 ENCOUNTER — Encounter: Payer: Self-pay | Admitting: Family Medicine

## 2023-10-16 DIAGNOSIS — R030 Elevated blood-pressure reading, without diagnosis of hypertension: Secondary | ICD-10-CM | POA: Insufficient documentation

## 2023-10-16 DIAGNOSIS — K219 Gastro-esophageal reflux disease without esophagitis: Secondary | ICD-10-CM | POA: Insufficient documentation

## 2023-10-16 DIAGNOSIS — R0981 Nasal congestion: Secondary | ICD-10-CM | POA: Insufficient documentation

## 2023-10-16 DIAGNOSIS — N529 Male erectile dysfunction, unspecified: Secondary | ICD-10-CM | POA: Insufficient documentation

## 2023-10-16 DIAGNOSIS — E782 Mixed hyperlipidemia: Secondary | ICD-10-CM | POA: Insufficient documentation

## 2023-10-16 LAB — CBC
Hematocrit: 50.2 % (ref 37.5–51.0)
Hemoglobin: 16.8 g/dL (ref 13.0–17.7)
MCH: 31.5 pg (ref 26.6–33.0)
MCHC: 33.5 g/dL (ref 31.5–35.7)
MCV: 94 fL (ref 79–97)
Platelets: 308 10*3/uL (ref 150–450)
RBC: 5.33 x10E6/uL (ref 4.14–5.80)
RDW: 13 % (ref 11.6–15.4)
WBC: 5.8 10*3/uL (ref 3.4–10.8)

## 2023-10-16 LAB — LIPID PANEL
Chol/HDL Ratio: 5 ratio (ref 0.0–5.0)
Cholesterol, Total: 190 mg/dL (ref 100–199)
HDL: 38 mg/dL — ABNORMAL LOW (ref >39)
LDL Chol Calc (NIH): 128 mg/dL — ABNORMAL HIGH (ref 0–99)
Triglycerides: 134 mg/dL (ref 0–149)
VLDL Cholesterol Cal: 24 mg/dL (ref 5–40)

## 2023-10-16 LAB — CMP14+EGFR
ALT: 20 [IU]/L (ref 0–44)
AST: 18 [IU]/L (ref 0–40)
Albumin: 4.5 g/dL (ref 4.1–5.1)
Alkaline Phosphatase: 78 [IU]/L (ref 44–121)
BUN/Creatinine Ratio: 11 (ref 9–20)
BUN: 13 mg/dL (ref 6–24)
Bilirubin Total: 0.4 mg/dL (ref 0.0–1.2)
CO2: 22 mmol/L (ref 20–29)
Calcium: 9.6 mg/dL (ref 8.7–10.2)
Chloride: 102 mmol/L (ref 96–106)
Creatinine, Ser: 1.22 mg/dL (ref 0.76–1.27)
Globulin, Total: 2.8 g/dL (ref 1.5–4.5)
Glucose: 76 mg/dL (ref 70–99)
Potassium: 4.5 mmol/L (ref 3.5–5.2)
Sodium: 140 mmol/L (ref 134–144)
Total Protein: 7.3 g/dL (ref 6.0–8.5)
eGFR: 73 mL/min/{1.73_m2} (ref >59)

## 2023-10-16 LAB — TSH: TSH: 1.52 u[IU]/mL (ref 0.450–4.500)

## 2023-10-16 NOTE — Assessment & Plan Note (Signed)
RF tadalafil.

## 2023-10-16 NOTE — Progress Notes (Signed)
Hi Demarion, your LDL cholesterol is elevated just slightly its at 128 normal is under 100.  But your total cholesterol and triglycerides look good.  So just encouraged her to continue to work on healthy diet and regular exercise.  Thyroid level looks great.  Kidney and liver looks great.  Blood count is normal.  No sign of anemia.

## 2023-10-16 NOTE — Assessment & Plan Note (Signed)
BP borderline elevated. Will monitor.

## 2023-10-16 NOTE — Assessment & Plan Note (Signed)
The 10-year ASCVD risk score (Arnett DK, et al., 2019) is: 3.9%   Values used to calculate the score:     Age: 47 years     Sex: Male     Is Non-Hispanic African American: No     Diabetic: No     Tobacco smoker: No     Systolic Blood Pressure: 136 mmHg     Is BP treated: No     HDL Cholesterol: 38 mg/dL     Total Cholesterol: 190 mg/dL

## 2023-10-16 NOTE — Assessment & Plan Note (Signed)
Continue antihistamine nightly.

## 2023-10-21 ENCOUNTER — Encounter: Payer: Self-pay | Admitting: Family Medicine

## 2023-10-21 DIAGNOSIS — N529 Male erectile dysfunction, unspecified: Secondary | ICD-10-CM

## 2023-10-21 MED ORDER — TADALAFIL 5 MG PO TABS: 5.0000 mg | ORAL_TABLET | Freq: Every day | ORAL | 3 refills | Status: AC | PRN

## 2023-11-18 DIAGNOSIS — J342 Deviated nasal septum: Secondary | ICD-10-CM | POA: Diagnosis not present

## 2023-11-18 DIAGNOSIS — R0982 Postnasal drip: Secondary | ICD-10-CM | POA: Diagnosis not present

## 2024-01-09 ENCOUNTER — Ambulatory Visit: Payer: BC Managed Care – PPO | Admitting: Family Medicine

## 2024-01-09 ENCOUNTER — Encounter: Payer: Self-pay | Admitting: Family Medicine

## 2024-01-09 VITALS — BP 144/60 | HR 107 | Ht 66.0 in | Wt 154.0 lb

## 2024-01-09 DIAGNOSIS — R0981 Nasal congestion: Secondary | ICD-10-CM | POA: Diagnosis not present

## 2024-01-09 DIAGNOSIS — K219 Gastro-esophageal reflux disease without esophagitis: Secondary | ICD-10-CM

## 2024-01-09 DIAGNOSIS — I10 Essential (primary) hypertension: Secondary | ICD-10-CM | POA: Insufficient documentation

## 2024-01-09 DIAGNOSIS — R222 Localized swelling, mass and lump, trunk: Secondary | ICD-10-CM

## 2024-01-09 MED ORDER — OMEPRAZOLE 20 MG PO CPDR: 20.0000 mg | DELAYED_RELEASE_CAPSULE | Freq: Every day | ORAL | 3 refills | Status: DC

## 2024-01-09 MED ORDER — LOSARTAN POTASSIUM 25 MG PO TABS: 25.0000 mg | ORAL_TABLET | Freq: Every day | ORAL | 1 refills | Status: AC

## 2024-01-09 NOTE — Assessment & Plan Note (Signed)
New dx. Will start losartan.  Discussed low salt diet.  F/U in 2 weeks for NV.

## 2024-01-09 NOTE — Progress Notes (Signed)
   Acute Office Visit  Subjective:     Patient ID: Kevin Shaffer, male    DOB: 01/05/1976, 48 y.o.   MRN: [id number]  Chief Complaint  Patient presents with   Cyst    HPI Patient is in today for lump on left low back. Noticed it 2-3 weeks ago.  No pain or injury. More noticeable when he flexes forward. Not currently exercising.    Needs refill on omeprazole. ENT wants him on it daily for now. Has excess mucous in his throat. Can cause gagging.  Alelrgy testing tomorrow.    Does take testosterone but would like to have labs drawn soon off of the medication.  Years ago levels were borderline low.    ROS      Objective:    BP (!) 144/60   Pulse (!) 107   Ht 5\' 6"  (1.676 m)   Wt 154 lb (69.9 kg)   SpO2 96%   BMI 24.86 kg/m    Physical Exam Vitals reviewed.  Constitutional:      Appearance: Normal appearance.  HENT:     Head: Normocephalic.  Pulmonary:     Effort: Pulmonary effort is normal.  Musculoskeletal:     Comments: More prominent muscle in left low back. No discrete mass. Non tender.  No sign of seb cyst or lipoma.   Neurological:     Mental Status: He is alert and oriented to person, place, and time.  Psychiatric:        Mood and Affect: Mood normal.        Behavior: Behavior normal.     No results found for any visits on 01/09/24.      Assessment & Plan:   Problem List Items Addressed This Visit       Cardiovascular and Mediastinum   Essential hypertension   New dx. Will start losartan.  Discussed low salt diet.  F/U in 2 weeks for NV.        Relevant Medications   losartan (COZAAR) 25 MG tablet     Digestive   GERD (gastroesophageal reflux disease) - Primary   Relevant Medications   omeprazole (PRILOSEC) 20 MG capsule     Other   Chronic nasal congestion   Refill PPI      Other Visit Diagnoses       Mass on back       Relevant Orders   [place] MiscellaneoUS Localization   DG Thoracic Spine W/Swimmers       Meds ordered this  encounter  Medications   omeprazole (PRILOSEC) 20 MG capsule    Sig: Take 1 capsule (20 mg total) by mouth daily.    Dispense:  90 capsule    Refill:  3   losartan (COZAAR) 25 MG tablet    Sig: Take 1 tablet (25 mg total) by mouth daily.    Dispense:  90 tablet    Refill:  1    Return in about 2 weeks (around 01/23/2024), or if symptoms worsen or fail to improve, for Nurse BP Check.  Nani Gasser, MD

## 2024-01-09 NOTE — Assessment & Plan Note (Signed)
Refill PPI 

## 2024-01-10 ENCOUNTER — Ambulatory Visit: Payer: BC Managed Care – PPO

## 2024-01-10 DIAGNOSIS — Z0389 Encounter for observation for other suspected diseases and conditions ruled out: Secondary | ICD-10-CM | POA: Diagnosis not present

## 2024-01-10 DIAGNOSIS — R222 Localized swelling, mass and lump, trunk: Secondary | ICD-10-CM

## 2024-01-10 DIAGNOSIS — J301 Allergic rhinitis due to pollen: Secondary | ICD-10-CM | POA: Diagnosis not present

## 2024-01-13 ENCOUNTER — Encounter: Payer: Self-pay | Admitting: Family Medicine

## 2024-01-13 DIAGNOSIS — R222 Localized swelling, mass and lump, trunk: Secondary | ICD-10-CM

## 2024-01-13 NOTE — Progress Notes (Signed)
Hi Kevin Shaffer, the ultrasound just showed different densities in that area.  So no discrete simple cyst or swelling so they did recommend further evaluation with CT or MRI.  Are you okay with moving forward with that?

## 2024-01-14 NOTE — Telephone Encounter (Signed)
Note says CT with contrast or MRI just want to clarify if I would order MR abd to get this area?

## 2024-01-15 NOTE — Telephone Encounter (Signed)
I called the imaging department. They will call back.

## 2024-01-23 ENCOUNTER — Ambulatory Visit (INDEPENDENT_AMBULATORY_CARE_PROVIDER_SITE_OTHER): Payer: BC Managed Care – PPO | Admitting: Family Medicine

## 2024-01-23 VITALS — BP 128/80 | HR 79 | Ht 66.0 in | Wt 153.0 lb

## 2024-01-23 DIAGNOSIS — R030 Elevated blood-pressure reading, without diagnosis of hypertension: Secondary | ICD-10-CM | POA: Diagnosis not present

## 2024-01-23 NOTE — Progress Notes (Signed)
Agree with documentation as above.   Nani Gasser, MD. Looks great!!!

## 2024-01-23 NOTE — Progress Notes (Signed)
Pt is here for a 2 week fup on  blood pressure denies trouble sleeping,palpitations or medication problems,Pt started losartan 25mg   on 01/09/24, pt states he's is tolerating well on medication and has not had any skipped doses, blood pressure reading have been in the low 100's over 80's at home

## 2024-01-24 NOTE — Telephone Encounter (Signed)
I called [facility] and ask should it be a CT or MRI for further imaging.

## 2024-02-11 ENCOUNTER — Ambulatory Visit
Admission: RE | Admit: 2024-02-11 | Discharge: 2024-02-11 | Disposition: A | Discharge status: Home / Self Care | Payer: BC Managed Care – PPO | Source: Ambulatory Visit | Attending: Family Medicine | Admitting: Family Medicine

## 2024-02-11 ENCOUNTER — Other Ambulatory Visit: Payer: Self-pay

## 2024-02-11 VITALS — BP 151/90 | HR 106 | Temp 98.6°F | Resp 19

## 2024-02-11 DIAGNOSIS — R059 Cough, unspecified: Secondary | ICD-10-CM | POA: Diagnosis not present

## 2024-02-11 DIAGNOSIS — R0981 Nasal congestion: Secondary | ICD-10-CM

## 2024-02-11 LAB — POCT INFLUENZA A/B
Influenza A, POC: NEGATIVE
Influenza B, POC: NEGATIVE

## 2024-02-11 LAB — POC SARS CORONAVIRUS 2 AG -  ED: SARS Coronavirus 2 Ag: NEGATIVE

## 2024-02-11 MED ORDER — BENZONATATE 200 MG PO CAPS: 200.0000 mg | ORAL_CAPSULE | Freq: Three times a day (TID) | ORAL | 0 refills | Status: AC | PRN

## 2024-02-11 MED ORDER — PROMETHAZINE-DM 6.25-15 MG/5ML PO SYRP: 5.0000 mL | ORAL_SOLUTION | Freq: Two times a day (BID) | ORAL | 0 refills | Status: DC | PRN

## 2024-02-11 MED ORDER — PREDNISONE 20 MG PO TABS: ORAL_TABLET | ORAL | 0 refills | Status: DC

## 2024-02-11 NOTE — ED Triage Notes (Signed)
 Pt presents to uc with co of flu like symptoms. Cough congestion sore throat and body aches since Saturday.

## 2024-02-11 NOTE — ED Provider Notes (Signed)
 [facility]    CSN: [id number] Arrival date & time: 02/11/24  0844      History   Chief Complaint Chief Complaint  Patient presents with   Nasal Congestion    Flu like symptoms - Entered by patient    HPI Kevin Shaffer is a 48 y.o. male.   HPI 49 year old male presents with flulike symptoms.  Reports cough, congestion, sore throat, and bodyaches for 2 to 3 days.  PMH significant for HTN, mixed HLD, and chronic nasal congestion.  Past Medical History:  Diagnosis Date   GERD (gastroesophageal reflux disease)     Patient Active Problem List   Diagnosis Date Noted   Essential hypertension 01/09/2024   Chronic nasal congestion 10/16/2023   ED (erectile dysfunction) 10/16/2023   GERD (gastroesophageal reflux disease) 10/16/2023   Elevated BP without diagnosis of hypertension 10/16/2023   Mixed hyperlipidemia 10/16/2023   Left knee pain 02/19/2014   Kidney stone 12/28/2010    Past Surgical History:  Procedure Laterality Date   KNEE SURGERY Left    NOSE SURGERY         Home Medications    Prior to Admission medications   Medication Sig Start Date End Date Taking? Authorizing Provider  benzonatate (TESSALON) 200 MG capsule Take 1 capsule (200 mg total) by mouth 3 (three) times daily as needed for up to 7 days. 02/11/24 02/18/24 Yes Trevor Iha, FNP  predniSONE (DELTASONE) 20 MG tablet Take 3 tabs PO daily x 5 days. 02/11/24  Yes Trevor Iha, FNP  promethazine-dextromethorphan (PROMETHAZINE-DM) 6.25-15 MG/5ML syrup Take 5 mLs by mouth 2 (two) times daily as needed for cough. 02/11/24  Yes Trevor Iha, FNP  losartan (COZAAR) 25 MG tablet Take 1 tablet (25 mg total) by mouth daily. 01/09/24   Agapito Games, MD  omeprazole (PRILOSEC) 20 MG capsule Take 1 capsule (20 mg total) by mouth daily. 01/09/24   Agapito Games, MD  tadalafil (CIALIS) 5 MG tablet Take 1 tablet (5 mg total) by mouth daily as needed for erectile dysfunction. 10/21/23    Agapito Games, MD    Family History Family History  Problem Relation Age of Onset   Lymphoma Father     Social History Social History   Tobacco Use   Smoking status: Some Days    Types: Cigars   Smokeless tobacco: Never  Substance Use Topics   Alcohol use: Yes    Alcohol/week: 3.0 - 5.0 standard drinks of alcohol    Types: 3 - 5 Standard drinks or equivalent per week     Allergies   Patient has no known allergies.   Review of Systems Review of Systems  HENT:  Positive for congestion and sore throat.   All other systems reviewed and are negative.    Physical Exam Triage Vital Signs ED Triage Vitals  Encounter Vitals Group     BP      Systolic BP Percentile      Diastolic BP Percentile      Pulse      Resp      Temp      Temp src      SpO2      Weight      Height      Head Circumference      Peak Flow      Pain Score      Pain Loc      Pain Education      Exclude from Growth Chart  No data found.  Updated Vital Signs BP (!) 151/90   Pulse (!) 106   Temp 98.6 F (37 C)   Resp 19   SpO2 98%     Physical Exam Vitals and nursing note reviewed.  Constitutional:      General: He is not in acute distress.    Appearance: Normal appearance. He is normal weight. He is ill-appearing.  HENT:     Head: Normocephalic and atraumatic.     Right Ear: Tympanic membrane and external ear normal.     Left Ear: Tympanic membrane and external ear normal.     Ears:     Comments: Moderate eustachian tube dysfunction noted bilaterally    Nose: Nose normal.     Mouth/Throat:     Mouth: Mucous membranes are moist.     Pharynx: Oropharynx is clear.  Eyes:     Extraocular Movements: Extraocular movements intact.     Conjunctiva/sclera: Conjunctivae normal.     Pupils: Pupils are equal, round, and reactive to light.  Cardiovascular:     Rate and Rhythm: Normal rate and regular rhythm.     Pulses: Normal pulses.     Heart sounds: Normal heart sounds.   Pulmonary:     Effort: Pulmonary effort is normal.     Breath sounds: Normal breath sounds. No wheezing, rhonchi or rales.     Comments: Infrequent nonproductive cough on exam Musculoskeletal:        General: Normal range of motion.     Cervical back: Normal range of motion and neck supple.  Skin:    General: Skin is warm and dry.  Neurological:     General: No focal deficit present.     Mental Status: He is alert and oriented to person, place, and time.  Psychiatric:        Mood and Affect: Mood normal.        Behavior: Behavior normal.        Thought Content: Thought content normal.      UC Treatments / Results  Labs (all labs ordered are listed, but only abnormal results are displayed) Labs Reviewed  POCT INFLUENZA A/B  POC SARS CORONAVIRUS 2 AG -  ED    EKG   Radiology No results found.  Procedures Procedures (including critical care time)  Medications Ordered in UC Medications - No data to display  Initial Impression / Assessment and Plan / UC Course  I have reviewed the triage vital signs and the nursing notes.  Pertinent labs & imaging results that were available during my care of the patient were reviewed by me and considered in my medical decision making (see chart for details).    MDM: 1.  Nasal congestion-Rx'd prednisone 20 mg tablet: Take 3 tablets p.o. daily x 5 days; 2.  Cough, unspecified type-Rx'd Tessalon 200 mg capsules: Take 1 capsule 3 times daily, as needed for cough, Rx'd Promethazine DM 6.25-15 Mg/5 mL syrup: Take 5 mL twice daily, as needed for cough. Advised patient to take medication as directed with food to completion.  Advised may take Tessalon capsules daily or as needed for cough.  Advised may use Promethazine DM for cough prior to sleep due to sedative effects.  Encouraged to increase daily water intake to 64 ounces per day while taking these medications.  Advised to worsen and/or unresolved please follow-up with your PCP or here for  further evaluation.  Work note provided to patient per her request prior to discharge.  Patient  discharged home, hemodynamically stable.  Final Clinical Impressions(s) / UC Diagnoses   Final diagnoses:  Cough, unspecified type  Nasal congestion     Discharge Instructions      Advised patient to take medication as directed with food to completion.  Advised may take Tessalon capsules daily or as needed for cough.  Advised may use Promethazine DM for cough prior to sleep due to sedative effects.  Encouraged to increase daily water intake to 64 ounces per day while taking these medications.  Advised to worsen and/or unresolved please follow-up with your PCP or here for further evaluation.     ED Prescriptions     Medication Sig Dispense Auth. Provider   predniSONE (DELTASONE) 20 MG tablet Take 3 tabs PO daily x 5 days. 15 tablet Trevor Iha, FNP   benzonatate (TESSALON) 200 MG capsule Take 1 capsule (200 mg total) by mouth 3 (three) times daily as needed for up to 7 days. 40 capsule Trevor Iha, FNP   promethazine-dextromethorphan (PROMETHAZINE-DM) 6.25-15 MG/5ML syrup Take 5 mLs by mouth 2 (two) times daily as needed for cough. 118 mL Trevor Iha, FNP      PDMP not reviewed this encounter.   Trevor Iha, FNP 02/11/24 [phone]

## 2024-02-11 NOTE — Discharge Instructions (Addendum)
 Advised patient to take medication as directed with food to completion.  Advised may take Tessalon capsules daily or as needed for cough.  Advised may use Promethazine DM for cough prior to sleep due to sedative effects.  Encouraged to increase daily water intake to 64 ounces per day while taking these medications.  Advised to worsen and/or unresolved please follow-up with your PCP or here for further evaluation.

## 2024-02-21 NOTE — Telephone Encounter (Signed)
  FINDINGS: At the site of concern there is a 1.6 x 1.8 x 0.2 cm mixed echogenicity mass. This is nonspecific in etiology.   IMPRESSION: Nonspecific mixed echogenicity mass at the site of concern. Recommend further evaluation with contrast-enhanced CT or MRI.  Orders Placed This Encounter  Procedures   CT THORACIC SPINE W CONTRAST    FINDINGS: At the site of concern there is a 1.6 x 1.8 x 0.2 cm mixed echogenicity mass. This is nonspecific in etiology.   IMPRESSION: Nonspecific mixed echogenicity mass at the site of concern. Recommend further evaluation with contrast-enhanced CT or MRI.    Standing Status:   Future    Expiration Date:   02/20/2025    If indicated for the ordered procedure, I authorize the administration of contrast media per Radiology protocol:   Yes    Does the patient have a contrast media/X-ray dye allergy?:   No    Preferred imaging location?:   [facility]

## 2024-03-05 ENCOUNTER — Ambulatory Visit

## 2024-03-05 DIAGNOSIS — R222 Localized swelling, mass and lump, trunk: Secondary | ICD-10-CM

## 2024-03-05 MED ORDER — IOHEXOL 300 MG/ML  SOLN
100.0000 mL | Freq: Once | INTRAMUSCULAR | Status: AC | PRN
  Administered 2024-03-05: 100 mL via INTRAVENOUS

## 2024-03-18 DIAGNOSIS — J309 Allergic rhinitis, unspecified: Secondary | ICD-10-CM | POA: Diagnosis not present

## 2024-03-25 ENCOUNTER — Encounter: Payer: Self-pay | Admitting: Family Medicine

## 2024-03-25 NOTE — Progress Notes (Signed)
 Hi Kevin Shaffer, we got your CT of your spine back.  They did put little skin marker over that soft tissue area the great part is they did not see any type of abnormal looking mass in this region which means that at least on the scan it looks like normal tissue.  That means it could just be fatty tissue for example so it just looks like everything else around it but they did not see a discrete cystic mass or or solid tumor there which again is very reassuring.  If you are at the point there were you would still like to have it potentially surgically removed we could always do a consultation with a surgeon.  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  You have some degenerative disc space there no narrowing of the canal which is good and mild to moderate narrowing of the foramen which is where the nerve roots come out at T9-T10 as well as a little bit of arthritis of the hinge parts of your spine.

## 2024-03-26 NOTE — Progress Notes (Signed)
 Hi Castor, we were able to connect with the imaging department.  And they have recommended either a CT with contrast of the pelvis or an MRI of the lumbar spine.  Are you okay with moving forward with 1 of these options?  I would prefer MRI if possible just because there is less radiation.

## 2024-03-30 ENCOUNTER — Other Ambulatory Visit: Payer: Self-pay | Admitting: Family Medicine

## 2024-03-30 ENCOUNTER — Telehealth: Payer: Self-pay

## 2024-03-30 DIAGNOSIS — R222 Localized swelling, mass and lump, trunk: Secondary | ICD-10-CM

## 2024-03-30 NOTE — Telephone Encounter (Signed)
 Attempted call to patient. Requesting a return call.

## 2024-03-30 NOTE — Progress Notes (Signed)
Will order CT

## 2024-03-30 NOTE — Progress Notes (Signed)
 Orders Placed This Encounter  Procedures   CT ABDOMEN PELVIS W CONTRAST    Narrative & Impression CLINICAL DATA:  Possible mass lower back.   EXAM: ULTRASOUND ABDOMEN LIMITED   COMPARISON:  None Available.   FINDINGS: At the site of concern there is a 1.6 x 1.8 x 0.2 cm mixed echogenicity mass. This is nonspecific in etiology.   IMPRESSION: Nonspecific mixed echogenicity mass at the site of concern. Recommend further evaluation with contrast-enhanced CT or MRI.    Standing Status:   Future    Expiration Date:   03/30/2025    If indicated for the ordered procedure, I authorize the administration of contrast media per Radiology protocol:   Yes    Does the patient have a contrast media/X-ray dye allergy?:   No    Preferred imaging location?:   [facility]    If indicated for the ordered procedure, I authorize the administration of oral contrast media per Radiology protocol:   Yes

## 2024-03-30 NOTE — Telephone Encounter (Signed)
 Copied from CRM [id number]. Topic: Clinical - Lab/Test Results >> Mar 30, 2024 12:23 PM Corin V wrote: Reason for CRM: Patient called to go over CT results. Please call patient back when available.

## 2024-04-03 ENCOUNTER — Ambulatory Visit

## 2024-04-03 DIAGNOSIS — Z0389 Encounter for observation for other suspected diseases and conditions ruled out: Secondary | ICD-10-CM | POA: Diagnosis not present

## 2024-04-03 DIAGNOSIS — R222 Localized swelling, mass and lump, trunk: Secondary | ICD-10-CM

## 2024-04-03 MED ORDER — IOHEXOL 300 MG/ML  SOLN
100.0000 mL | Freq: Once | INTRAMUSCULAR | Status: AC | PRN
  Administered 2024-04-03: 100 mL via INTRAVENOUS

## 2024-04-06 ENCOUNTER — Encounter: Payer: Self-pay | Admitting: Family Medicine

## 2024-04-06 NOTE — Telephone Encounter (Signed)
 Last read by Nelta Bane at 8:42AM on 04/06/2024.

## 2024-04-06 NOTE — Progress Notes (Signed)
 Hi Kevin Shaffer, CT abdomen pelvis looks reassuring.  They did not see any acute findings such as a mass or any bony lesions.  It really looks like this lump blends into your soft tissue which again is just reassuring that it is not any type of mass etc.

## 2024-04-06 NOTE — Telephone Encounter (Signed)
 Last read by Nelta Bane at 8:42AM on 04/06/2024.  Patient has read CT results message from Dr. Greer Leak.

## 2024-05-14 ENCOUNTER — Ambulatory Visit: Admitting: Family Medicine

## 2024-05-14 ENCOUNTER — Encounter: Payer: Self-pay | Admitting: Family Medicine

## 2024-05-14 VITALS — BP 125/77 | HR 83 | Ht 66.0 in | Wt 155.0 lb

## 2024-05-14 DIAGNOSIS — R5383 Other fatigue: Secondary | ICD-10-CM

## 2024-05-14 DIAGNOSIS — R6882 Decreased libido: Secondary | ICD-10-CM | POA: Diagnosis not present

## 2024-05-14 DIAGNOSIS — R6889 Other general symptoms and signs: Secondary | ICD-10-CM

## 2024-05-14 NOTE — Patient Instructions (Signed)
 If levels are low then we will plan to repeat the testosterone level another morning in about 2 weeks for confirmation and then we can go from there to submit to insurance.

## 2024-05-14 NOTE — Progress Notes (Signed)
 Established Patient Office Visit  Subjective  Patient ID: Kevin Shaffer, male    DOB: 13-Feb-1976  Age: 48 y.o. MRN: [id number]  Chief Complaint  Patient presents with   Medical Management of Chronic Issues    Pt reports that he would like to have his Testosterone checked he stated that he has been feeling fatigued and tired for sometime now.    HPI He is here today because he would like to have his testosterone levels evaluated. In his mid 30s he was notices he had persistant muscle soreness, etc at the gym  he saw his doc at that time and testosterone was him on the low end of normal but did not technically qualify for testosterone replacement therapy at that time.  So in his late 30s he decided to start taking testosterone on his own and has up until about 4 months ago when he completely stopped.  He says since then he has noticed a significant drop in energy level, libido and decrease in strength and endurance.  He has also had a lot more muscle aches and pains after working out requiring a lot more longer recovery time.  He denies feeling down or depressed or hopeless.  Recent thyroid  level in the fall looked great.  He is noticing he is more tired he will falls asleep more easily but then around 1 or 2 AM just tosses and turns.  He would like to have his testosterone levels checked again today.     ROS    Objective:     BP 125/77   Pulse 83   Ht 5\' 6"  (1.676 m)   Wt 155 lb 0.6 oz (70.3 kg)   SpO2 99%   BMI 25.02 kg/m    Physical Exam Vitals reviewed.  Constitutional:      Appearance: Normal appearance.  HENT:     Head: Normocephalic.  Pulmonary:     Effort: Pulmonary effort is normal.  Neurological:     Mental Status: He is alert and oriented to person, place, and time.  Psychiatric:        Mood and Affect: Mood normal.        Behavior: Behavior normal.      No results found for any visits on 05/14/24.    The 10-year ASCVD risk score (Arnett DK, et al.,  2019) is: 10%    Assessment & Plan:   Problem List Items Addressed This Visit   None Visit Diagnoses       Other fatigue    -  Primary   Relevant Orders   Testosterone, Free, Total, SHBG   Follicle stimulating hormone   Luteinizing hormone   Prolactin     Low libido       Relevant Orders   Testosterone, Free, Total, SHBG   Follicle stimulating hormone   Luteinizing hormone   Prolactin     Worsening functional endurance       Relevant Orders   Testosterone, Free, Total, SHBG   Follicle stimulating hormone   Luteinizing hormone   Prolactin      Check labs today including testosterone, FSH, LH and prolactin.  We discussed that if the labs are low we will need to recheck the testosterone level in about 2 weeks for confirmation and then we can certainly discuss testosterone options (gels, injections, etc) at that point in time and discussed pros and cons of the medication.  Return if symptoms worsen or fail to improve.    Bynum Cassis  Greer Leak, MD

## 2024-05-16 LAB — TESTOSTERONE, FREE, TOTAL, SHBG
Sex Hormone Binding: 30.8 nmol/L (ref 16.5–55.9)
Testosterone, Free: 9.5 pg/mL (ref 6.8–21.5)
Testosterone: 525 ng/dL (ref 264–916)

## 2024-05-16 LAB — LUTEINIZING HORMONE: LH: 4.1 m[IU]/mL (ref 1.7–8.6)

## 2024-05-16 LAB — FOLLICLE STIMULATING HORMONE: FSH: 3.7 m[IU]/mL (ref 1.5–12.4)

## 2024-05-16 LAB — PROLACTIN: Prolactin: 7.3 ng/mL (ref 3.9–22.7)

## 2024-05-20 ENCOUNTER — Ambulatory Visit: Payer: Self-pay | Admitting: Family Medicine

## 2024-05-20 ENCOUNTER — Encounter: Payer: Self-pay | Admitting: Family Medicine

## 2024-05-20 NOTE — Progress Notes (Signed)
 Alem, testosterone levels actually look pretty good you are in the middle of normal.  Free testosterone also looks okay as well as sex hormone binding.  Your FSH and LH which are the hormones from your brain that help trigger testosterone production are also in the normal range as well as your prolactin right now your levels are not in a place where testosterone replacement will be covered by your insurance since you are technically in the normal range.

## 2024-06-15 ENCOUNTER — Ambulatory Visit: Admitting: Family Medicine

## 2025-01-05 ENCOUNTER — Telehealth: Admitting: Physician Assistant

## 2025-01-05 DIAGNOSIS — J069 Acute upper respiratory infection, unspecified: Secondary | ICD-10-CM | POA: Diagnosis not present

## 2025-01-05 MED ORDER — FLUTICASONE PROPIONATE 50 MCG/ACT NA SUSP: 2.0000 | Freq: Every day | NASAL | 0 refills | Status: AC

## 2025-01-05 MED ORDER — PSEUDOEPH-BROMPHEN-DM 30-2-10 MG/5ML PO SYRP: 5.0000 mL | ORAL_SOLUTION | Freq: Four times a day (QID) | ORAL | 0 refills | Status: AC | PRN

## 2025-01-05 NOTE — Patient Instructions (Signed)
 " Kevin Shaffer, thank you for joining Delon CHRISTELLA Dickinson, PA-C for today's virtual visit.  While this provider is not your primary care provider (PCP), if your PCP is located in our provider database this encounter information will be shared with them immediately following your visit.   A [facility] MyChart account gives you access to today's visit and all your visits, tests, and labs performed at [facility]  click here if you don't have a [facility] MyChart account or go to [email or web address]  Consent: (Patient) Kevin Shaffer provided verbal consent for this virtual visit at the beginning of the encounter.  Current Medications:  Current Outpatient Medications:    Azelastine HCl 137 MCG/SPRAY SOLN, Place 1 spray into both nostrils 2 (two) times daily., Disp: , Rfl:    brompheniramine-pseudoephedrine-DM 30-2-10 MG/5ML syrup, Take 5 mLs by mouth 4 (four) times daily as needed., Disp: 120 mL, Rfl: 0   fluticasone  (FLONASE ) 50 MCG/ACT nasal spray, Place 2 sprays into both nostrils daily., Disp: 16 g, Rfl: 0   losartan  (COZAAR ) 25 MG tablet, Take 1 tablet (25 mg total) by mouth daily., Disp: 90 tablet, Rfl: 1   omeprazole  (PRILOSEC) 20 MG capsule, Take 1 capsule (20 mg total) by mouth daily., Disp: 90 capsule, Rfl: 3   tadalafil  (CIALIS ) 5 MG tablet, Take 1 tablet (5 mg total) by mouth daily as needed for erectile dysfunction., Disp: 90 tablet, Rfl: 3   Medications ordered in this encounter:  Meds ordered this encounter  Medications   brompheniramine-pseudoephedrine-DM 30-2-10 MG/5ML syrup    Sig: Take 5 mLs by mouth 4 (four) times daily as needed.    Dispense:  120 mL    Refill:  0    Supervising Provider:   LAMPTEY, PHILIP O [id number]   fluticasone  (FLONASE ) 50 MCG/ACT nasal spray    Sig: Place 2 sprays into both nostrils daily.    Dispense:  16 g    Refill:  0    Supervising Provider:   BLAISE ALEENE KIDD [[id number]]     *If you need refills on other medications  prior to your next appointment, please contact your pharmacy*  Follow-Up: Call back or seek an in-person evaluation if the symptoms worsen or if the condition fails to improve as anticipated.  [facility] [phone]  Other Instructions Viral Respiratory Infection A respiratory infection is an illness that affects part of the respiratory system, such as the lungs, nose, or throat. A respiratory infection that is caused by a virus is called a viral respiratory infection. Common types of viral respiratory infections include: A cold. The flu (influenza). A respiratory syncytial virus (RSV) infection. What are the causes? This condition is caused by a virus. The virus may spread through contact with droplets or direct contact with infected people or their mucus or secretions. The virus may spread from person to person (is contagious). What are the signs or symptoms? Symptoms of this condition include: A stuffy or runny nose. A sore throat or cough. Shortness of breath or difficulty breathing. Yellow or green mucus (sputum). Other symptoms may include: A fever. Sweating or chills. Fatigue. Achy muscles. A headache. How is this diagnosed? This condition may be diagnosed based on: Your symptoms. A physical exam. Testing of secretions from the nose or throat. Chest X-ray. How is this treated? This condition may be treated with medicines, such as: Antiviral medicine. This may shorten the length of time a person has symptoms. Expectorants. These make it easier  to cough up mucus. Decongestant nasal sprays. Acetaminophen or NSAIDs, such as ibuprofen, to relieve fever and pain. Antibiotic medicines are not prescribed for viral infections.This is because antibiotics are designed to kill bacteria. They do not kill viruses. Follow these instructions at home: Managing pain and congestion Take over-the-counter and prescription medicines only as told by your health care  provider. If you have a sore throat, gargle with a mixture of salt and water 3-4 times a day or as needed. To make salt water, completely dissolve -1 tsp (3-6 g) of salt in 1 cup (237 mL) of warm water. Use nose drops made from salt water to ease congestion and soften raw skin around your nose. Take 2 tsp (10 mL) of honey at bedtime to lessen coughing at night. Do not give honey to children who are younger than 1 year. Drink enough fluid to keep your urine pale yellow. This helps prevent dehydration and helps loosen up mucus. General instructions  Rest as much as possible. Do not drink alcohol. Do not use any products that contain nicotine or tobacco. These products include cigarettes, chewing tobacco, and vaping devices, such as e-cigarettes. If you need help quitting, ask your health care provider. Keep all follow-up visits. This is important. How is this prevented?     Get an annual flu shot. You may get the flu shot in late summer, fall, or winter. Ask your health care provider when you should get your flu shot. Avoid spreading your infection to other people. If you are sick: Wash your hands with soap and water often, especially after you cough or sneeze. Wash for at least 20 seconds. If soap and water are not available, use alcohol-based hand sanitizer. Cover your mouth when you cough. Cover your nose and mouth when you sneeze. Do not share cups or eating utensils. Clean commonly used objects often. Clean commonly touched surfaces. Stay home from work or school as told by your health care provider. Avoid contact with people who are sick during cold and flu season. This is generally fall and winter. Contact a health care provider if: Your symptoms last for 10 days or longer. Your symptoms get worse over time. You have severe sinus pain in your face or forehead. The glands in your jaw or neck become very swollen. You have shortness of breath. Get help right away if you: Feel pain  or pressure in your chest. Have trouble breathing. Faint or feel like you will faint. Have severe and persistent vomiting. Feel confused or disoriented. These symptoms may represent a serious problem that is an emergency. Do not wait to see if the symptoms will go away. Get medical help right away. Call your local emergency services (911 in the [place] Do not drive yourself to the hospital. Summary A respiratory infection is an illness that affects part of the respiratory system, such as the lungs, nose, or throat. A respiratory infection that is caused by a virus is called a viral respiratory infection. Common types of viral respiratory infections include a cold, influenza, and respiratory syncytial virus (RSV) infection. Symptoms of this condition include a stuffy or runny nose, cough, fatigue, achy muscles, sore throat, and fevers or chills. Antibiotic medicines are not prescribed for viral infections. This is because antibiotics are designed to kill bacteria. They are not effective against viruses. This information is not intended to replace advice given to you by your health care provider. Make sure you discuss any questions you have with your health care provider.  Document Revised: 03/16/2021 Document Reviewed: 03/16/2021 Elsevier Patient Education  2024 [employer]   If you have been instructed to have an in-person evaluation today at a local Urgent Care facility, please use the link below. It will take you to a list of all of our available [facility], including address, phone number and hours of operation. Please do not delay care.  [facility]  If you or a family member do not have a primary care provider, use the link below to schedule a visit and establish care. When you choose a [facility] primary care physician or advanced practice provider, you gain a long-term partner in health. Find a Primary Care Provider  Learn more about [facility]'s in-office  and virtual care options: [facility] Get [facility] Now "

## 2025-01-05 NOTE — Progress Notes (Signed)
 " Virtual Visit Consent   Kevin Shaffer, you are scheduled for a virtual visit with a [facility] provider today. Just as with appointments in the office, your consent must be obtained to participate. Your consent will be active for this visit and any virtual visit you may have with one of our providers in the next 365 days. If you have a MyChart account, a copy of this consent can be sent to you electronically.  As this is a virtual visit, video technology does not allow for your provider to perform a traditional examination. This may limit your provider's ability to fully assess your condition. If your provider identifies any concerns that need to be evaluated in person or the need to arrange testing (such as labs, EKG, etc.), we will make arrangements to do so. Although advances in technology are sophisticated, we cannot ensure that it will always work on either your end or our end. If the connection with a video visit is poor, the visit may have to be switched to a telephone visit. With either a video or telephone visit, we are not always able to ensure that we have a secure connection.  By engaging in this virtual visit, you consent to the provision of healthcare and authorize for your insurance to be billed (if applicable) for the services provided during this visit. Depending on your insurance coverage, you may receive a charge related to this service.  I need to obtain your verbal consent now. Are you willing to proceed with your visit today? Kevin Shaffer has provided verbal consent on 01/05/2025 for a virtual visit (video or telephone). Delon CHRISTELLA Dickinson, PA-C  Date: 01/05/2025 8:41 AM   Virtual Visit via Video Note   I, Delon CHRISTELLA Dickinson, connected with  Kevin Shaffer  ([id number], 1976-07-07) on 01/05/2025 at  8:30 AM EST by a video-enabled telemedicine application and verified that I am speaking with the correct person using two identifiers.  Location: Patient: Virtual Visit Location  Patient: Home Provider: Virtual Visit Location Provider: Home Office   I discussed the limitations of evaluation and management by telemedicine and the availability of in person appointments. The patient expressed understanding and agreed to proceed.    History of Present Illness: Kevin Shaffer is a 49 y.o. who identifies as a male who was assigned male at birth, and is being seen today for sore throat, congestion, cough.  HPI: URI  This is a new problem. The current episode started yesterday. The problem has been gradually worsening. There has been no fever. Associated symptoms include congestion, coughing (intermittently), ear pain (right), headaches, a plugged ear sensation (right) and a sore throat. Pertinent negatives include no diarrhea, nausea, rhinorrhea, sinus pain, sneezing, vomiting or wheezing. Associated symptoms comments: fatigue. Treatments tried: nyquil. The treatment provided no relief.   Wife was sick with similar symptoms last week.  Problems:  Patient Active Problem List   Diagnosis Date Noted   Essential hypertension 01/09/2024   Chronic nasal congestion 10/16/2023   ED (erectile dysfunction) 10/16/2023   GERD (gastroesophageal reflux disease) 10/16/2023   Elevated BP without diagnosis of hypertension 10/16/2023   Mixed hyperlipidemia 10/16/2023   Left knee pain 02/19/2014   Kidney stone 12/28/2010    Allergies: Allergies[1] Medications: Current Medications[2]  Observations/Objective: Patient is well-developed, well-nourished in no acute distress.  Resting comfortably at home.  Head is normocephalic, atraumatic.  No labored breathing.  Speech is clear and coherent with logical content.  Patient is alert and  oriented at baseline.    Assessment and Plan: 1. Viral URI with cough (Primary) - brompheniramine-pseudoephedrine-DM 30-2-10 MG/5ML syrup; Take 5 mLs by mouth 4 (four) times daily as needed.  Dispense: 120 mL; Refill: 0 - fluticasone  (FLONASE ) 50  MCG/ACT nasal spray; Place 2 sprays into both nostrils daily.  Dispense: 16 g; Refill: 0  - Suspect viral URI - Symptomatic medications of choice over the counter as needed - Added Bromfed DM for cough and congestion - Added Flonase , continue Azelastine - Push fluids - Rest - Seek further evaluation if symptoms change or worsen   Follow Up Instructions: I discussed the assessment and treatment plan with the patient. The patient was provided an opportunity to ask questions and all were answered. The patient agreed with the plan and demonstrated an understanding of the instructions.  A copy of instructions were sent to the patient via MyChart unless otherwise noted below.    The patient was advised to call back or seek an in-person evaluation if the symptoms worsen or if the condition fails to improve as anticipated.    Delon HERO Toren Tucholski, PA-C     [1] No Known Allergies [2]  Current Outpatient Medications:    Azelastine HCl 137 MCG/SPRAY SOLN, Place 1 spray into both nostrils 2 (two) times daily., Disp: , Rfl:    brompheniramine-pseudoephedrine-DM 30-2-10 MG/5ML syrup, Take 5 mLs by mouth 4 (four) times daily as needed., Disp: 120 mL, Rfl: 0   fluticasone  (FLONASE ) 50 MCG/ACT nasal spray, Place 2 sprays into both nostrils daily., Disp: 16 g, Rfl: 0   losartan  (COZAAR ) 25 MG tablet, Take 1 tablet (25 mg total) by mouth daily., Disp: 90 tablet, Rfl: 1   omeprazole  (PRILOSEC) 20 MG capsule, Take 1 capsule (20 mg total) by mouth daily., Disp: 90 capsule, Rfl: 3   tadalafil  (CIALIS ) 5 MG tablet, Take 1 tablet (5 mg total) by mouth daily as needed for erectile dysfunction., Disp: 90 tablet, Rfl: 3  "

## 2025-01-10 ENCOUNTER — Other Ambulatory Visit: Payer: Self-pay | Admitting: Family Medicine

## 2025-01-10 DIAGNOSIS — K219 Gastro-esophageal reflux disease without esophagitis: Secondary | ICD-10-CM
